# Patient Record
Sex: Female | Born: 2000 | Race: White | Hispanic: No | Marital: Single | Smoking: Never smoker
Health system: Southern US, Community
[De-identification: ages and names within clinical notes are randomized; demographics above are authoritative.]

---

## 2021-04-26 ENCOUNTER — Emergency Department
Admission: EM | Admit: 2021-04-26 | Discharge: 2021-04-27 | Disposition: A | Discharge status: Home / Self Care | Payer: Self-pay | Attending: Emergency Medicine | Admitting: Emergency Medicine

## 2021-04-26 ENCOUNTER — Other Ambulatory Visit: Payer: Self-pay

## 2021-04-26 DIAGNOSIS — R1032 Left lower quadrant pain: Secondary | ICD-10-CM

## 2021-04-26 DIAGNOSIS — K5900 Constipation, unspecified: Secondary | ICD-10-CM | POA: Insufficient documentation

## 2021-04-26 LAB — URINALYSIS, ROUTINE W REFLEX MICROSCOPIC
Bacteria, UA: NONE SEEN
Bilirubin Urine: NEGATIVE
Glucose, UA: NEGATIVE mg/dL
Ketones, ur: NEGATIVE mg/dL
Leukocytes,Ua: NEGATIVE
Nitrite: NEGATIVE
Protein, ur: NEGATIVE mg/dL
Specific Gravity, Urine: 1.005 (ref 1.005–1.030)
pH: 8 (ref 5.0–8.0)

## 2021-04-26 LAB — CBC
HCT: 40.6 % (ref 36.0–46.0)
Hemoglobin: 13.4 g/dL (ref 12.0–15.0)
MCH: 28 pg (ref 26.0–34.0)
MCHC: 33 g/dL (ref 30.0–36.0)
MCV: 84.8 fL (ref 80.0–100.0)
Platelets: 266 10*3/uL (ref 150–400)
RBC: 4.79 MIL/uL (ref 3.87–5.11)
RDW: 12.1 % (ref 11.5–15.5)
WBC: 7.4 10*3/uL (ref 4.0–10.5)
nRBC: 0 % (ref 0.0–0.2)

## 2021-04-26 LAB — COMPREHENSIVE METABOLIC PANEL
ALT: 12 U/L (ref 0–44)
AST: 17 U/L (ref 15–41)
Albumin: 4 g/dL (ref 3.5–5.0)
Alkaline Phosphatase: 40 U/L (ref 38–126)
Anion gap: 5 (ref 5–15)
BUN: 14 mg/dL (ref 6–20)
CO2: 26 mmol/L (ref 22–32)
Calcium: 9.4 mg/dL (ref 8.9–10.3)
Chloride: 107 mmol/L (ref 98–111)
Creatinine, Ser: 0.74 mg/dL (ref 0.44–1.00)
GFR, Estimated: >60 mL/min (ref >60)
Glucose, Bld: 116 mg/dL — ABNORMAL HIGH (ref 70–99)
Potassium: 3.9 mmol/L (ref 3.5–5.1)
Sodium: 138 mmol/L (ref 135–145)
Total Bilirubin: 0.5 mg/dL (ref 0.3–1.2)
Total Protein: 7.1 g/dL (ref 6.5–8.1)

## 2021-04-26 LAB — POC URINE PREG, ED: Preg Test, Ur: NEGATIVE

## 2021-04-26 LAB — LIPASE, BLOOD: Lipase: 30 U/L (ref 11–51)

## 2021-04-26 NOTE — ED Triage Notes (Signed)
Pt presents to ER c/o left sided abs pain that started around 2000 tonight.  Pt states pain is stabbing in nature.  Pt states last BM was this morning. Pt denies n/v.  Pt denies any other medical problems.  Denies urinary symptoms.  Pt A&O x4 at this time in NAD.

## 2021-04-27 ENCOUNTER — Emergency Department: Payer: Self-pay

## 2021-04-27 IMAGING — CT CT ABD-PELV W/ CM
2 of 4 series · 16 of 46 positions shown, 18 images · IV contrast (APPLIED)
Comparison: None.

CLINICAL DATA: Left lower quadrant abdominal pain.

EXAM:
CT ABDOMEN AND PELVIS WITH CONTRAST
TECHNIQUE: Multidetector CT imaging of the abdomen and pelvis was performed
using the standard protocol following bolus administration of
intravenous contrast.

[Series 2: axial st · axial · 0.78mm/px · z∈[-507,-92]mm · 13 of 91 slices shown, 15 images]
[im 4/91  soft-tissue]
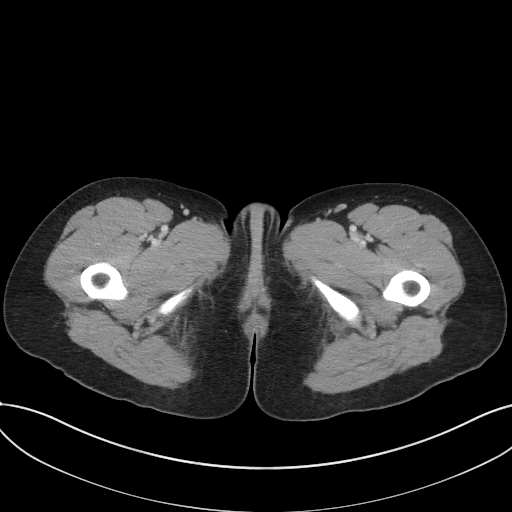
[im 4/91  bone]
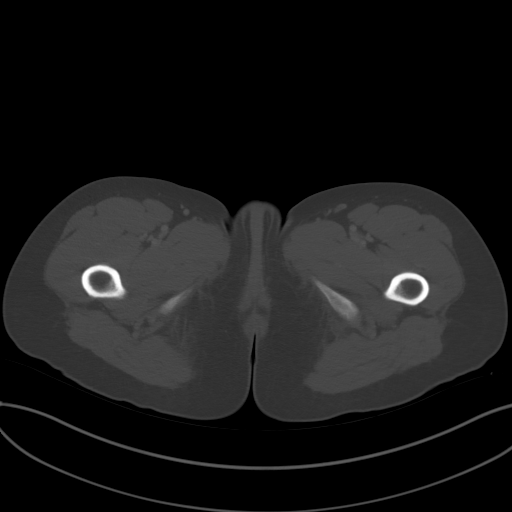
[im 12/91  soft-tissue]
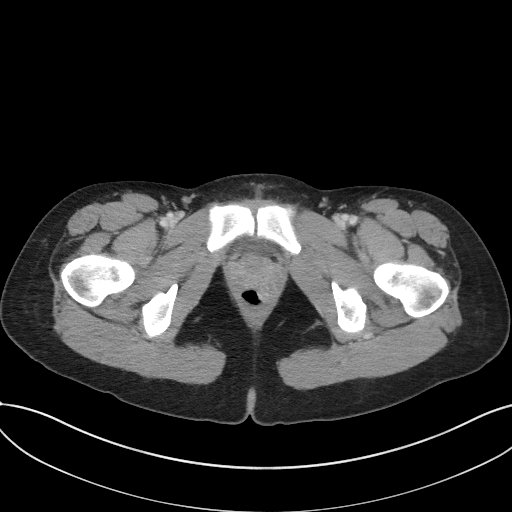
[im 19/91  soft-tissue]
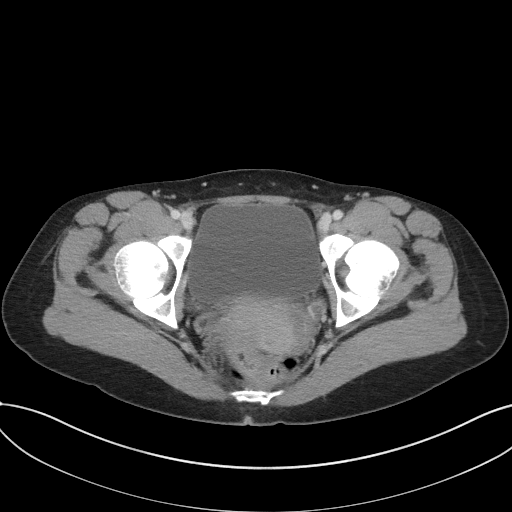
[im 27/91  soft-tissue]
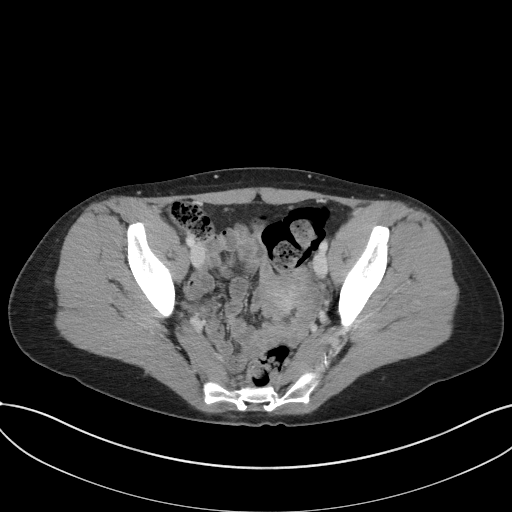
[im 31/91  soft-tissue]
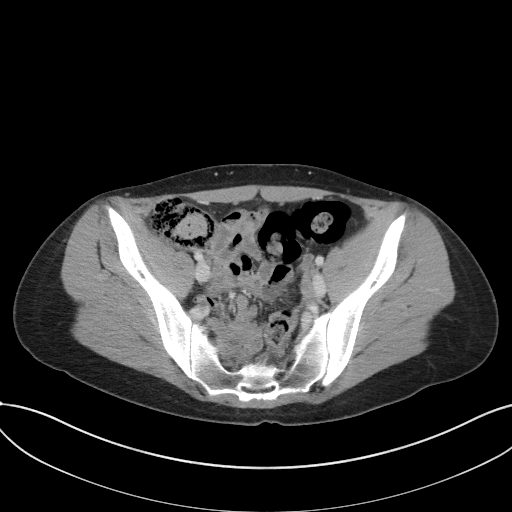
[im 38/91  soft-tissue]
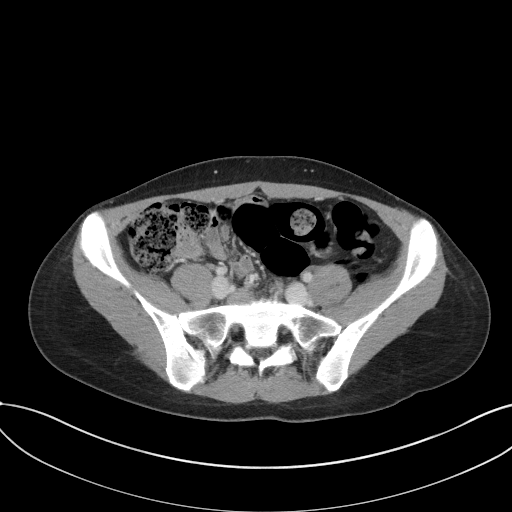
[im 46/91  soft-tissue]
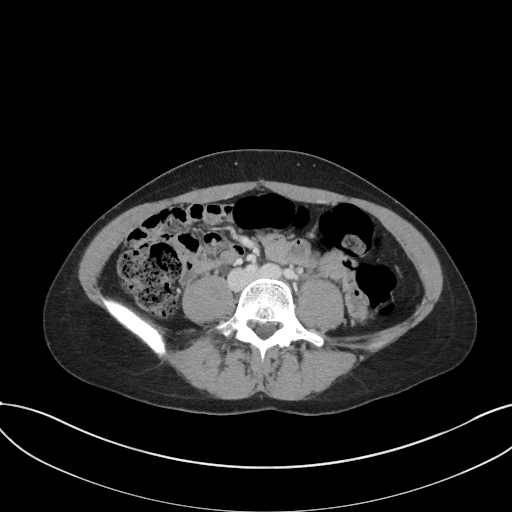
[im 53/91  soft-tissue]
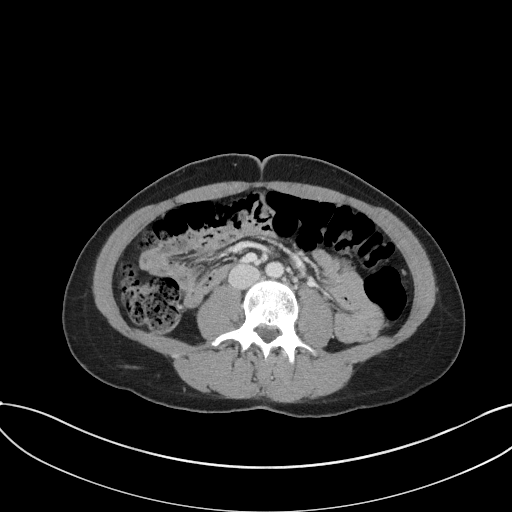
[im 61/91  soft-tissue]
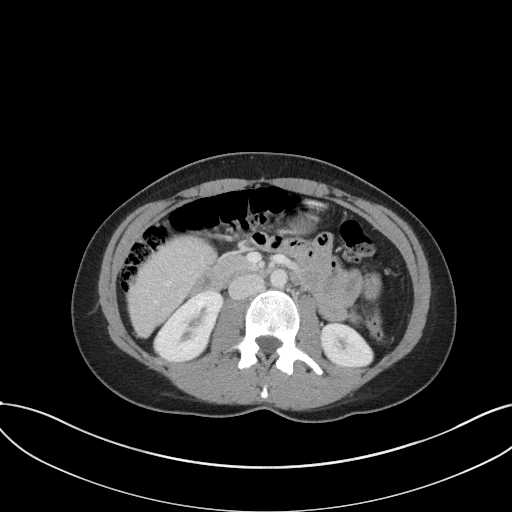
[im 61/91  bone]
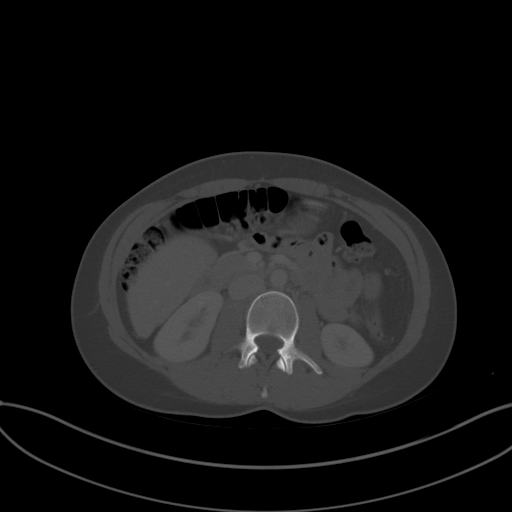
[im 64/91  soft-tissue]
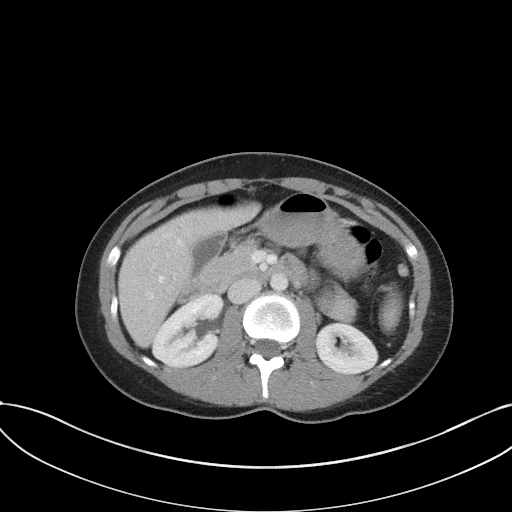
[im 72/91  soft-tissue]
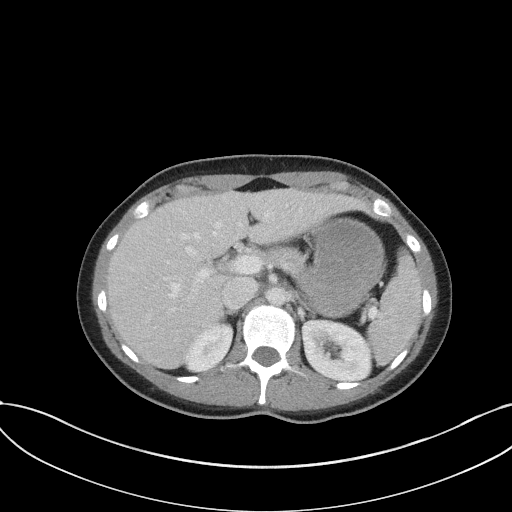
[im 79/91  soft-tissue]
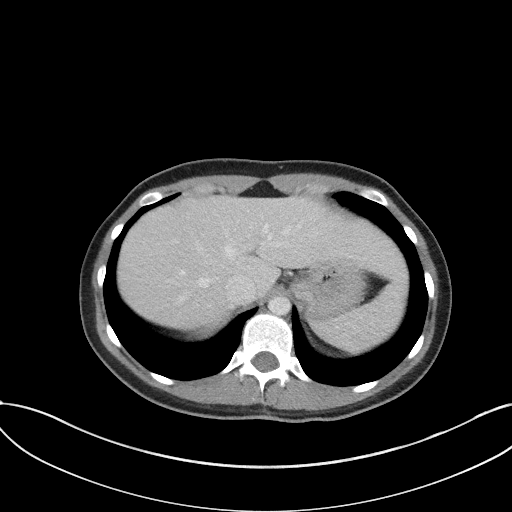
[im 87/91  soft-tissue]
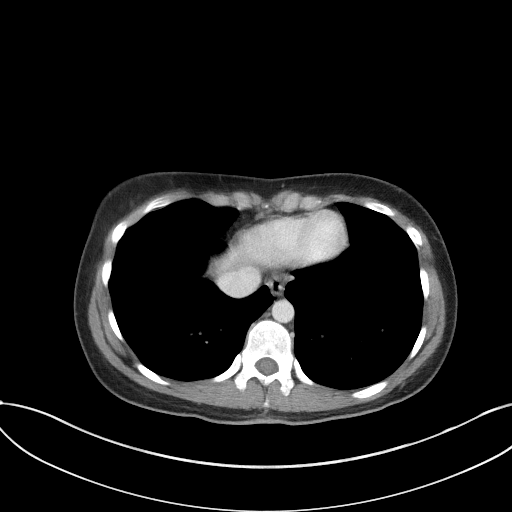

[Series 5: coronal st · coronal · 0.65mm/px · 3 of 73 slices shown]
[im 25/73  soft-tissue]
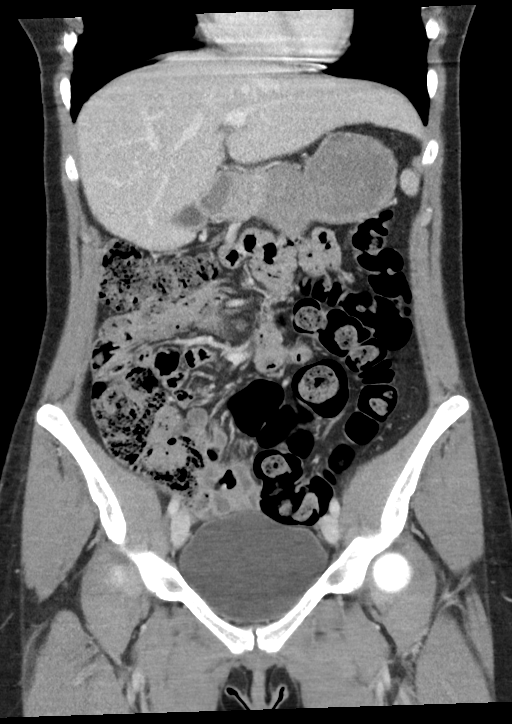
[im 33/73  soft-tissue]
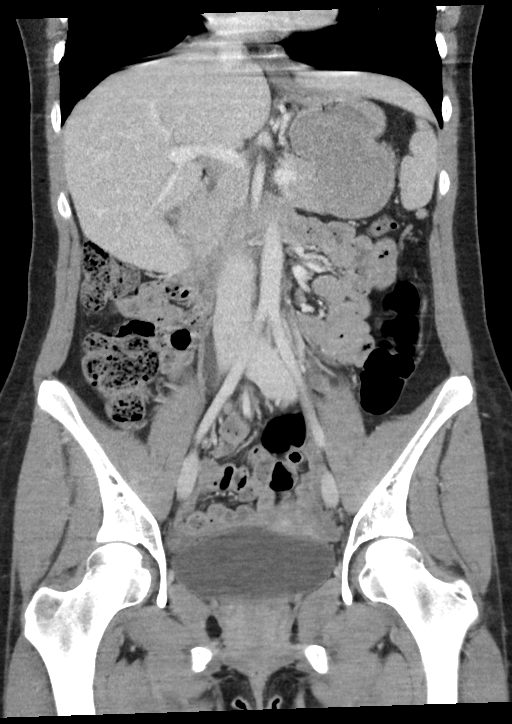
[im 41/73  soft-tissue]
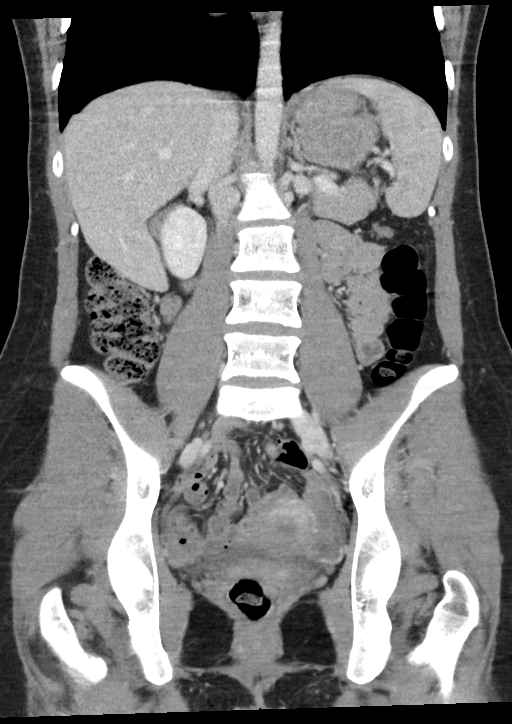

[16 of 46 positions shown; findings below may reference images not displayed]

RADIATION DOSE REDUCTION: This exam was performed according to the
departmental dose-optimization program which includes automated
exposure control, adjustment of the mA and/or kV according to
patient size and/or use of iterative reconstruction technique.

CONTRAST:  100mL OMNIPAQUE IOHEXOL 300 MG/ML  SOLN
FINDINGS: Lower chest: The visualized lung bases are clear.

No intra-abdominal free air or free fluid.

Hepatobiliary: No focal liver abnormality is seen. No gallstones,
gallbladder wall thickening, or biliary dilatation.

Pancreas: Unremarkable. No pancreatic ductal dilatation or
surrounding inflammatory changes.

Spleen: Normal in size without focal abnormality.

Adrenals/Urinary Tract: The adrenal glands unremarkable. The
kidneys, visualized ureters, and the bladder appear unremarkable.

Stomach/Bowel: Moderate stool throughout the colon. There is no
bowel obstruction or active inflammation. The appendix is normal.

Vascular/Lymphatic: The abdominal aorta and IVC are unremarkable. No
portal venous gas. There is no adenopathy.

Reproductive: The uterus is anteverted and grossly unremarkable. No
adnexal masses.

Other: None

Musculoskeletal: No acute or significant osseous findings.
IMPRESSION: 1. No acute intra-abdominal or pelvic pathology.
2. Moderate colonic stool burden. No bowel obstruction. Normal
appendix.

## 2021-04-27 MED ORDER — LACTULOSE 10 GM/15ML PO SOLN: 20.0000 g | Freq: Every day | ORAL | 0 refills | Status: AC | PRN

## 2021-04-27 MED ORDER — SODIUM CHLORIDE 0.9 % IV BOLUS
500.0000 mL | Freq: Once | INTRAVENOUS | Status: AC
  Administered 2021-04-27: 500 mL via INTRAVENOUS

## 2021-04-27 MED ORDER — IOHEXOL 300 MG/ML  SOLN
100.0000 mL | Freq: Once | INTRAMUSCULAR | Status: AC | PRN
  Administered 2021-04-27: 100 mL via INTRAVENOUS

## 2021-04-27 NOTE — Discharge Instructions (Signed)
You may take Lactulose as needed for bowel movements.  Return to the ER for worsening symptoms, persistent vomiting, difficulty breathing or other concerns. 

## 2021-04-27 NOTE — ED Provider Notes (Signed)
[facility] Provider Note    Event Date/Time   First MD Initiated Contact with Patient 04/27/21 0000     (approximate)   History   Abdominal Pain   HPI  Angela Bruce is a 21 y.o. female who presents to the ED from [facility] with a chief complaint of abdominal pain.  Patient reports left-sided abdominal pain which began around 8 PM.  Describes pain from her umbilicus to her left lower quadrant, stabbing, waxing/waning.  Not associated with nausea, vomiting, dysuria, vaginal discharge or diarrhea.  Last BM this morning which is normal for patient.  Denies fever, chills, cough, chest pain, shortness of breath.  Denies recent travel.  No personal history of bowel or pelvic issues.  No family history of bowel issues.     Past Medical History  None   Active Problem List  There are no problems to display for this patient.    Past Surgical History  History reviewed. No pertinent surgical history.   Home Medications   Prior to Admission medications   Medication Sig Start Date End Date Taking? Authorizing Provider  lactulose (CHRONULAC) 10 GM/15ML solution Take 30 mLs (20 g total) by mouth daily as needed for mild constipation. 04/27/21  Yes Paulette Blanch, MD     Allergies  Patient has no known allergies.   Family History  History reviewed. No pertinent family history.   Physical Exam  Triage Vital Signs: ED Triage Vitals  Enc Vitals Group     BP 04/26/21 2243 128/79     Pulse Rate 04/26/21 2243 83     Resp 04/26/21 2243 16     Temp 04/26/21 2243 98.2 F (36.8 C)     Temp Source 04/26/21 2243 Oral     SpO2 04/26/21 2243 97 %     Weight 04/26/21 2244 130 lb (59 kg)     Height 04/26/21 2244 5\' 7"  (1.702 m)     Head Circumference --      Peak Flow --      Pain Score 04/26/21 2244 3     Pain Loc --      Pain Edu? --      Excl. in [place]? --     Updated Vital Signs: BP 109/77    Pulse 74    Temp 98.2 F (36.8 C) (Oral)    Resp 16     Ht 5\' 7"  (1.702 m)    Wt 59 kg    LMP 04/19/2021 (Approximate)    SpO2 100%    BMI 20.36 kg/m    General: Awake, no distress.  CV:  RRR.  Good peripheral perfusion.  Resp:  Normal effort.  CTAB. Abd:  Minimally tender to left lower quadrant without rebound or guarding.  No distention.  Other:  No vesicles.   ED Results / Procedures / Treatments  Labs (all labs ordered are listed, but only abnormal results are displayed) Labs Reviewed  COMPREHENSIVE METABOLIC PANEL - Abnormal; Notable for the following components:      Result Value   Glucose, Bld 116 (*)    All other components within normal limits  URINALYSIS, ROUTINE W REFLEX MICROSCOPIC - Abnormal; Notable for the following components:   Color, Urine STRAW (*)    APPearance CLEAR (*)    Hgb urine dipstick SMALL (*)    All other components within normal limits  LIPASE, BLOOD  CBC  POC URINE PREG, ED     EKG  None  RADIOLOGY I independently interpreted and visualized the CT abdomen and pelvis as well as the radiology interpretation:  CT abdomen pelvis: No acute intra-abdominal or pelvic pathology; moderate stool burden  Official radiology report(s): CT Abdomen Pelvis W Contrast  Result Date: 04/27/2021 CLINICAL DATA:  Left lower quadrant abdominal pain. EXAM: CT ABDOMEN AND PELVIS WITH CONTRAST TECHNIQUE: Multidetector CT imaging of the abdomen and pelvis was performed using the standard protocol following bolus administration of intravenous contrast. RADIATION DOSE REDUCTION: This exam was performed according to the departmental dose-optimization program which includes automated exposure control, adjustment of the mA and/or kV according to patient size and/or use of iterative reconstruction technique. CONTRAST:  188mL OMNIPAQUE IOHEXOL 300 MG/ML  SOLN COMPARISON:  None. FINDINGS: Lower chest: The visualized lung bases are clear. No intra-abdominal free air or free fluid. Hepatobiliary: No focal liver abnormality is seen.  No gallstones, gallbladder wall thickening, or biliary dilatation. Pancreas: Unremarkable. No pancreatic ductal dilatation or surrounding inflammatory changes. Spleen: Normal in size without focal abnormality. Adrenals/Urinary Tract: The adrenal glands unremarkable. The kidneys, visualized ureters, and the bladder appear unremarkable. Stomach/Bowel: Moderate stool throughout the colon. There is no bowel obstruction or active inflammation. The appendix is normal. Vascular/Lymphatic: The abdominal aorta and IVC are unremarkable. No portal venous gas. There is no adenopathy. Reproductive: The uterus is anteverted and grossly unremarkable. No adnexal masses. Other: None Musculoskeletal: No acute or significant osseous findings. IMPRESSION: 1. No acute intra-abdominal or pelvic pathology. 2. Moderate colonic stool burden. No bowel obstruction. Normal appendix. Electronically Signed   By: Anner Crete M.D.   On: 04/27/2021 01:10     PROCEDURES:  Critical Care performed: No  Procedures   MEDICATIONS ORDERED IN ED: Medications  sodium chloride 0.9 % bolus 500 mL (0 mLs Intravenous Stopped 04/27/21 0244)  iohexol (OMNIPAQUE) 300 MG/ML solution 100 mL (100 mLs Intravenous Contrast Given 04/27/21 0055)     IMPRESSION / MDM / ASSESSMENT AND PLAN / ED COURSE  I reviewed the triage vital signs and the nursing notes.                             21 year old female presenting with abdominal pain. Differential diagnosis includes, but is not limited to, ovarian cyst, ovarian torsion, acute appendicitis, diverticulitis, urinary tract infection/pyelonephritis, endometriosis, bowel obstruction, colitis, renal colic, gastroenteritis, hernia, fibroids, endometriosis, pregnancy related pain including ectopic pregnancy, etc.   Laboratory results demonstrate normal WBC, LFTs/lipase, normal UA, negative pregnancy.  Discussed with patient's risk/benefits of obtaining CT scan to evaluate for etiology of her symptoms;  patient would like to proceed.  Currently does not voice desire for pain medication.  Clinical Course as of 04/27/21 0322  Thu Apr 27, 2021  0124 Updated patient on CT scan remarkable for moderate stool burden, no acute intra-abdominal etiology.  Will discharge home on lactulose to use as needed.  Strict return precautions given.  Patient verbalizes understanding and agrees with plan of care. [JS]    Clinical Course User Index [JS] Paulette Blanch, MD     FINAL CLINICAL IMPRESSION(S) / ED DIAGNOSES   Final diagnoses:  Left lower quadrant abdominal pain  Constipation, unspecified constipation type     Rx / DC Orders   ED Discharge Orders          Ordered    lactulose (CHRONULAC) 10 GM/15ML solution  Daily PRN        04/27/21 0202  Note:  This document was prepared using Dragon voice recognition software and may include unintentional dictation errors.   Paulette Blanch, MD 04/27/21 [phone]

## 2021-04-27 NOTE — ED Notes (Signed)
Patient transported to CT
